# Patient Record
Sex: Female | Born: 1980 | Race: White | Hispanic: No | Marital: Single | Smoking: Current every day smoker
Health system: Southern US, Community
[De-identification: ages and names within clinical notes are randomized; demographics above are authoritative.]

---

## 2004-04-13 ENCOUNTER — Emergency Department: Payer: Self-pay | Admitting: Emergency Medicine

## 2004-12-02 ENCOUNTER — Emergency Department: Payer: Self-pay | Admitting: Emergency Medicine

## 2005-06-13 ENCOUNTER — Emergency Department: Payer: Self-pay | Admitting: Internal Medicine

## 2005-07-05 ENCOUNTER — Emergency Department: Payer: Self-pay | Admitting: Emergency Medicine

## 2005-07-19 ENCOUNTER — Emergency Department: Payer: Self-pay | Admitting: Emergency Medicine

## 2005-10-01 ENCOUNTER — Emergency Department: Payer: Self-pay | Admitting: Internal Medicine

## 2005-10-24 ENCOUNTER — Observation Stay: Payer: Self-pay | Admitting: Obstetrics and Gynecology

## 2005-11-01 ENCOUNTER — Other Ambulatory Visit: Payer: Self-pay

## 2005-11-01 ENCOUNTER — Emergency Department: Payer: Self-pay | Admitting: Emergency Medicine

## 2006-04-06 ENCOUNTER — Emergency Department: Payer: Self-pay | Admitting: Emergency Medicine

## 2006-06-11 ENCOUNTER — Emergency Department: Payer: Self-pay | Admitting: Emergency Medicine

## 2007-01-04 ENCOUNTER — Emergency Department: Payer: Self-pay | Admitting: Emergency Medicine

## 2007-01-09 ENCOUNTER — Ambulatory Visit: Payer: Self-pay | Admitting: Internal Medicine

## 2007-05-09 ENCOUNTER — Ambulatory Visit: Payer: Self-pay | Admitting: Family Medicine

## 2007-08-22 ENCOUNTER — Emergency Department: Payer: Self-pay | Admitting: Emergency Medicine

## 2007-10-10 ENCOUNTER — Emergency Department (HOSPITAL_COMMUNITY): Admission: EM | Admit: 2007-10-10 | Discharge: 2007-10-10 | Payer: Self-pay | Admitting: Emergency Medicine

## 2007-11-12 ENCOUNTER — Emergency Department: Payer: Self-pay | Admitting: Emergency Medicine

## 2008-02-23 ENCOUNTER — Emergency Department: Payer: Self-pay | Admitting: Emergency Medicine

## 2009-02-17 IMAGING — US US PELV - US TRANSVAGINAL
1 series · 17 of 25 positions shown · non-contrast
Comparison: none

REASON FOR EXAM: sp pain , right adnexal pain, sudden onset, torsion
COMMENTS:

[Series 1: us pelv - us transvaginal · 17 of 49 slices shown]
[im 1/49]
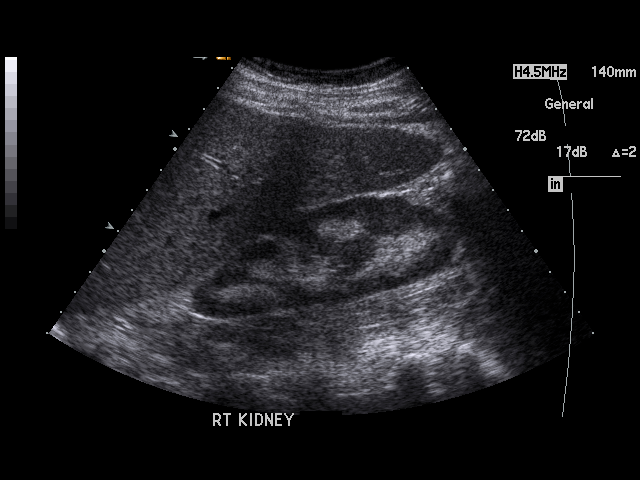
[im 5/49]
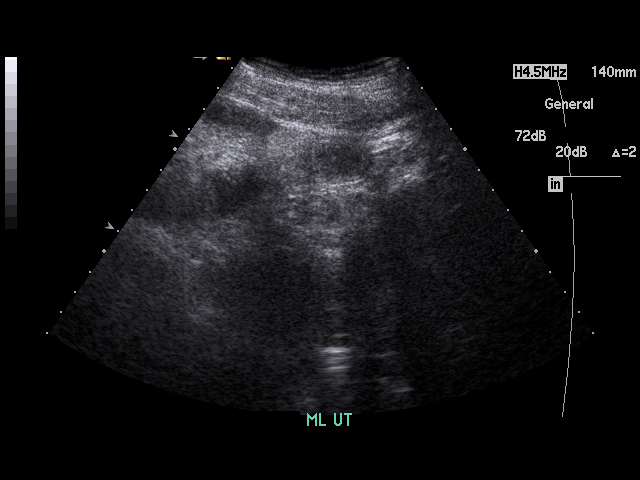
[im 7/49]
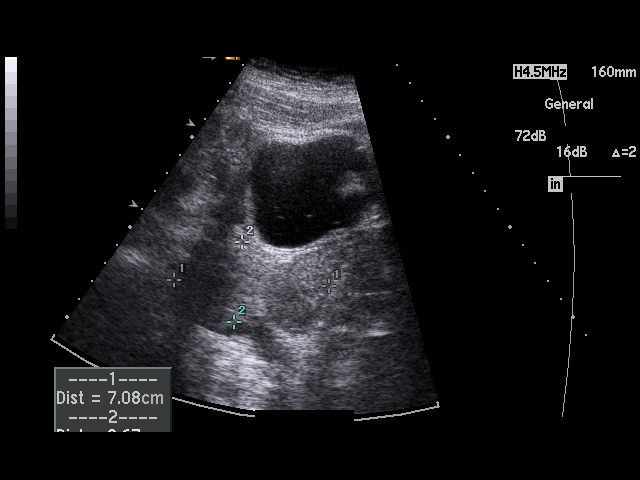
[im 11/49]
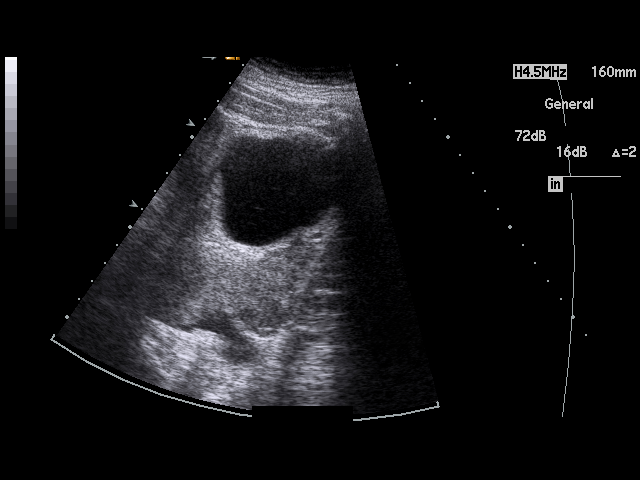
[im 13/49]
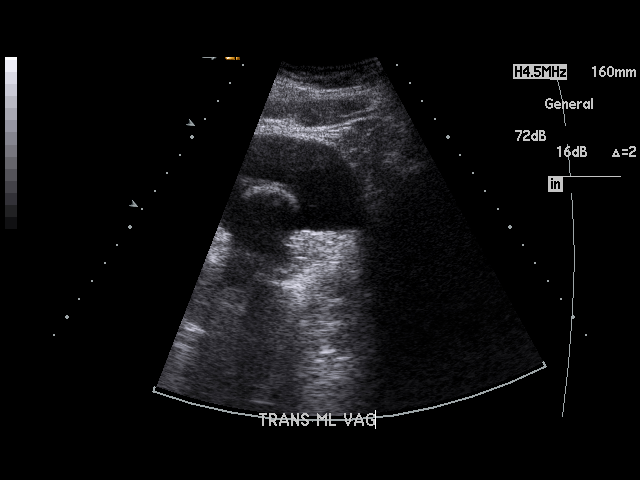
[im 17/49]
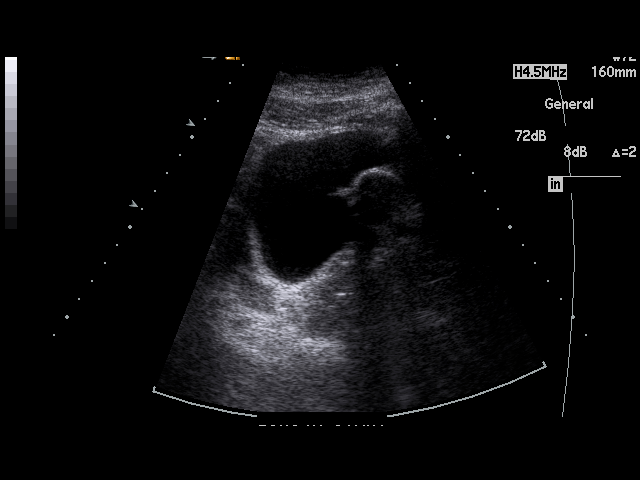
[im 19/49]
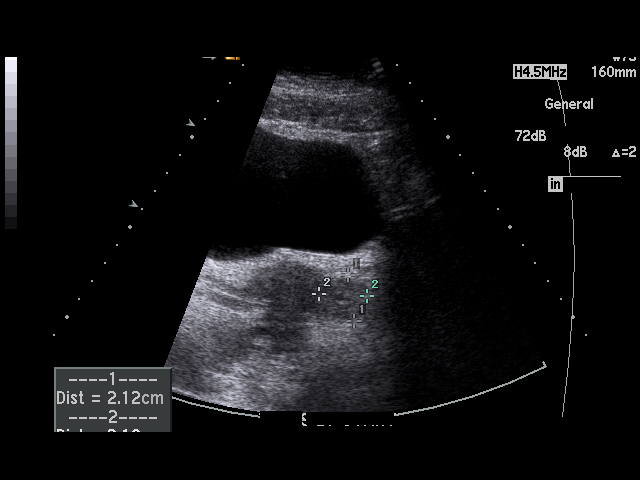
[im 23/49]
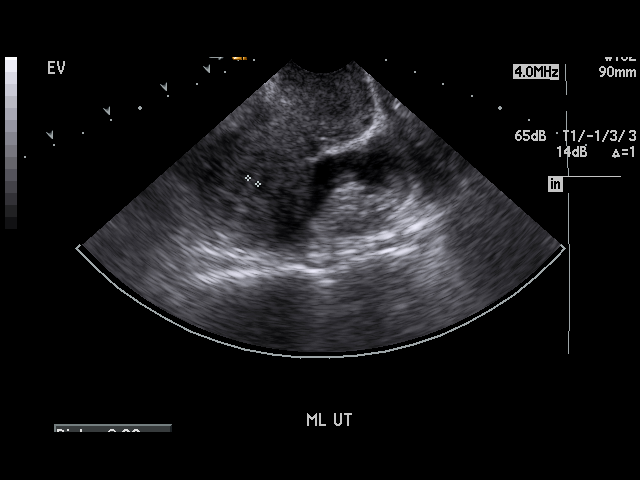
[im 25/49]
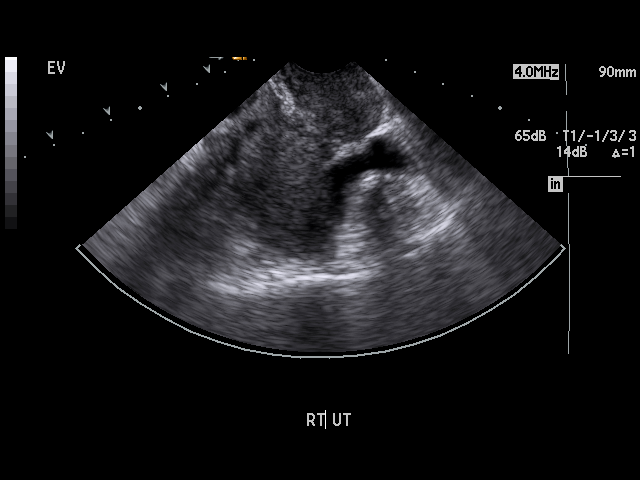
[im 27/49]
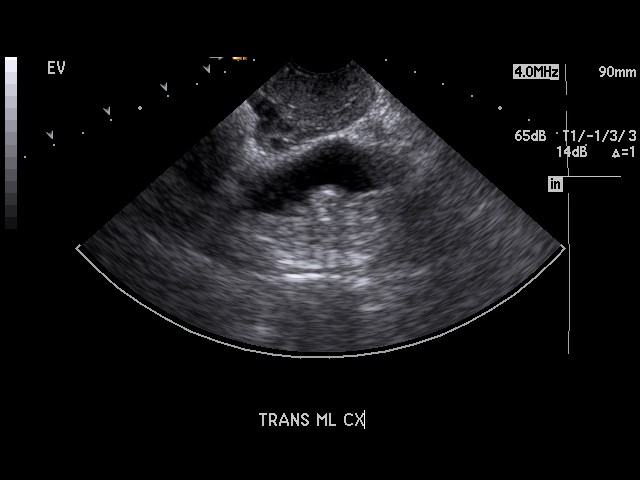
[im 31/49]
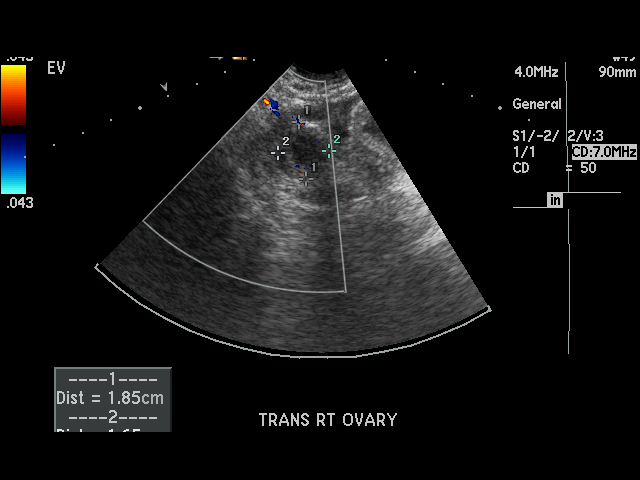
[im 33/49]
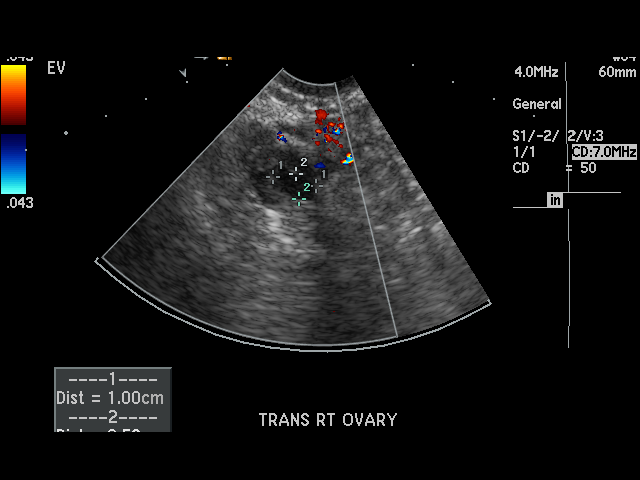
[im 37/49]
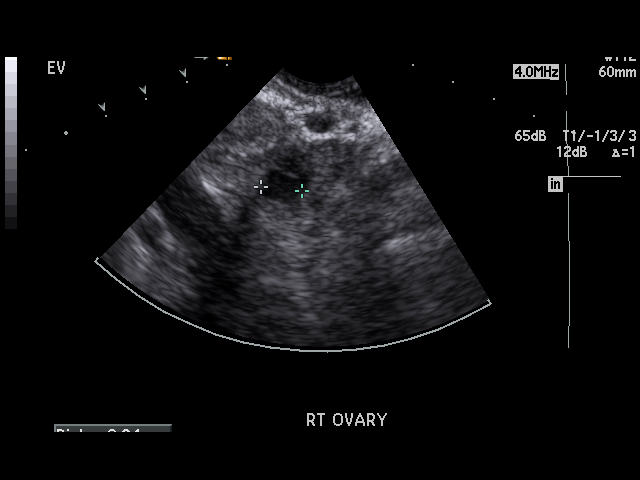
[im 39/49]
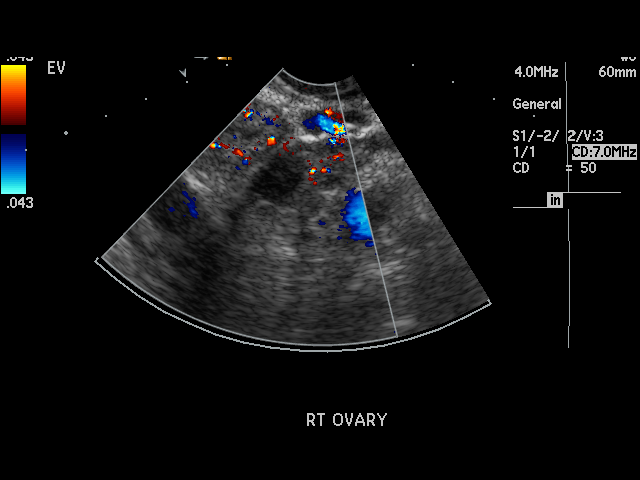
[im 43/49]
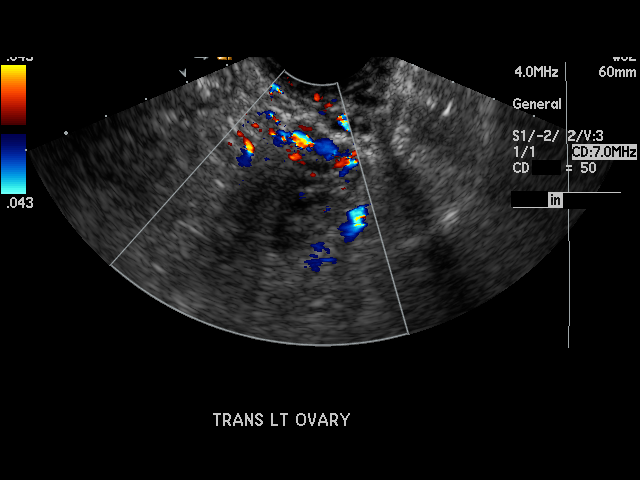
[im 45/49]
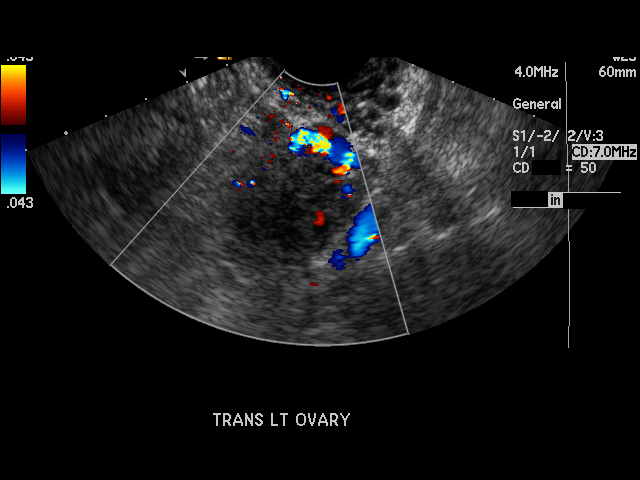
[im 49/49]
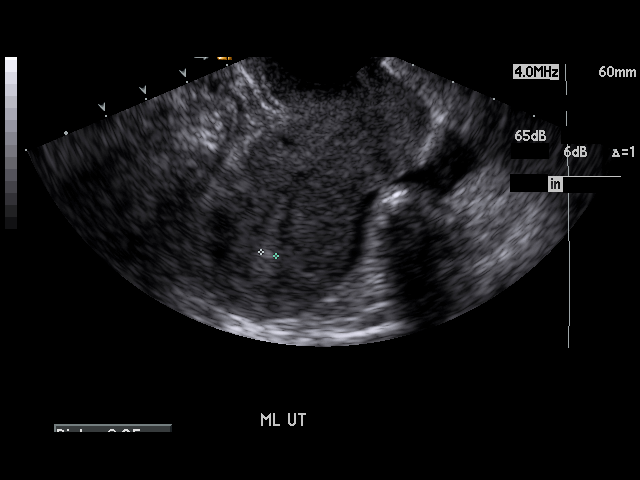

[17 of 25 positions shown; findings below may reference images not displayed]

PROCEDURE:     US  - US PELVIS MASS EXAM  - [DATE]  [DATE] [DATE]  [DATE]

RESULT:     Sonographic evaluation of the pelvis is performed utilizing
transabdominal and endovaginal scanning. The uterus measures 7.1 x 3.7 x
cm. The endometrial stripe thickness is 3.5 mm. The kidneys appear to be
grossly normal. There is urine in the bladder. The LEFT ovary is normal at
1.3 x 1.8 x 1.3 cm. The RIGHT ovary contains a 1.0 x 0.5 x 0.9 cm follicle.
A small amount of free fluid is seen in the cul-de-sac.
IMPRESSION: RIGHT ovarian follicle. Essentially normal pelvic sonogram.

## 2009-05-20 ENCOUNTER — Emergency Department: Payer: Self-pay | Admitting: Emergency Medicine

## 2009-07-09 ENCOUNTER — Ambulatory Visit: Payer: Self-pay | Admitting: Gastroenterology

## 2009-08-11 ENCOUNTER — Ambulatory Visit: Payer: Self-pay | Admitting: Gastroenterology

## 2010-07-02 ENCOUNTER — Emergency Department: Payer: Self-pay | Admitting: Emergency Medicine

## 2010-11-02 ENCOUNTER — Emergency Department: Payer: Self-pay | Admitting: Emergency Medicine

## 2011-07-09 ENCOUNTER — Ambulatory Visit: Payer: Self-pay | Admitting: Obstetrics & Gynecology

## 2011-07-09 LAB — CBC
HCT: 37.9 % (ref 35.0–47.0)
HGB: 12.4 g/dL (ref 12.0–16.0)
MCHC: 32.6 g/dL (ref 32.0–36.0)
MCV: 90 fL (ref 80–100)
RBC: 4.21 10*6/uL (ref 3.80–5.20)
RDW: 12.8 % (ref 11.5–14.5)

## 2011-07-09 LAB — PREGNANCY, URINE: Pregnancy Test, Urine: NEGATIVE m[IU]/mL

## 2011-07-15 ENCOUNTER — Ambulatory Visit: Payer: Self-pay | Admitting: Obstetrics & Gynecology

## 2011-07-15 LAB — PREGNANCY, URINE: Pregnancy Test, Urine: NEGATIVE m[IU]/mL

## 2011-07-19 LAB — PATHOLOGY REPORT

## 2011-10-30 ENCOUNTER — Emergency Department: Payer: Self-pay | Admitting: Emergency Medicine

## 2012-08-15 IMAGING — US US PELV - US TRANSVAGINAL
1 series · 17 of 25 positions shown · non-contrast
Comparison: none

REASON FOR EXAM: Pelvic pain with vaginal mass
COMMENTS:   LMP: Two weeks ago

[Series 1: us pelv - us transvaginal · 17 of 49 slices shown]
[im 1/49]
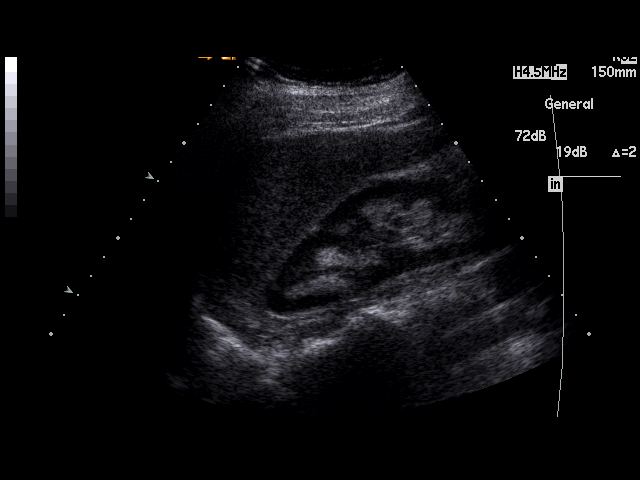
[im 5/49]
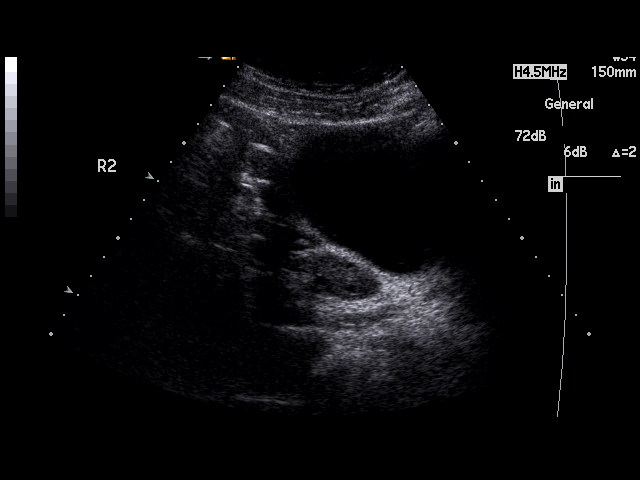
[im 7/49]
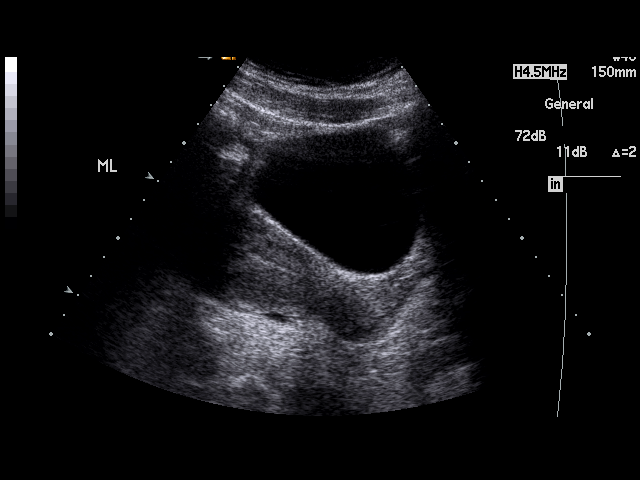
[im 11/49]
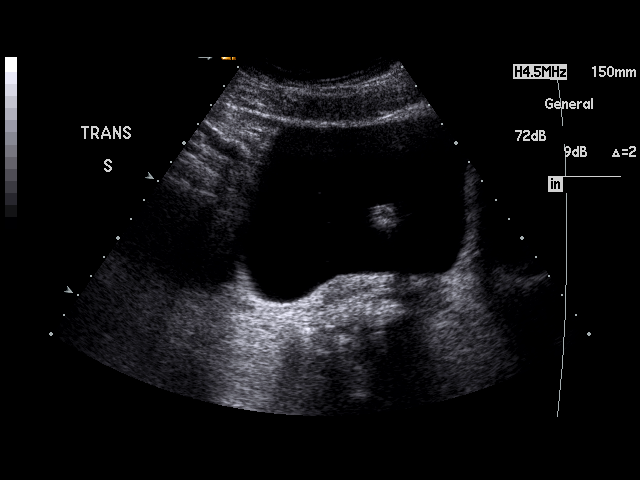
[im 13/49]
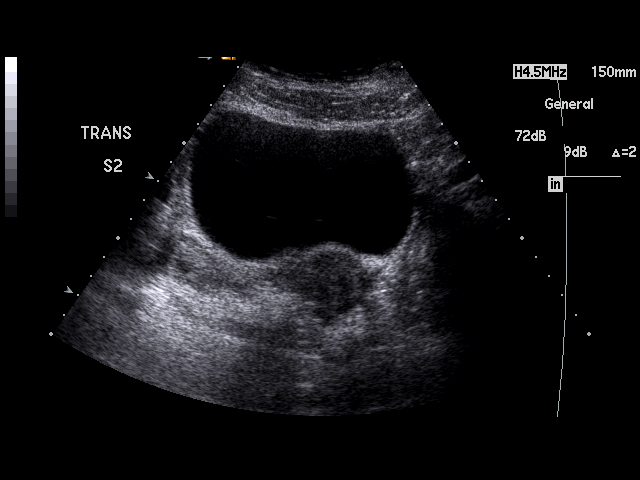
[im 17/49]
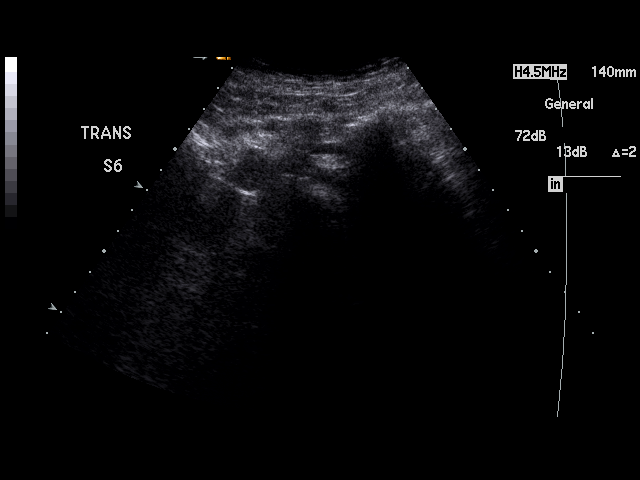
[im 19/49]
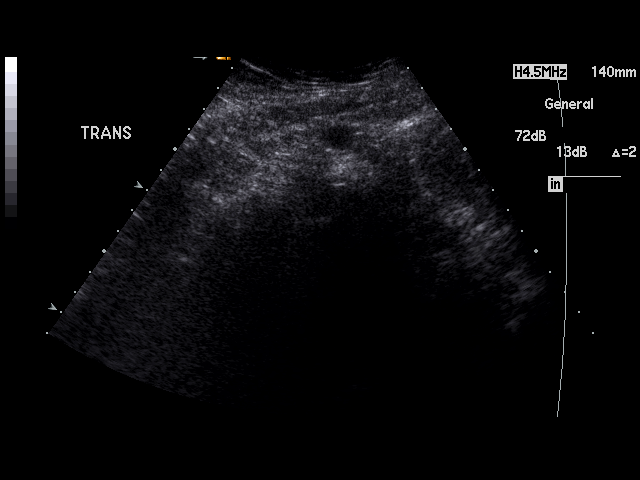
[im 23/49]
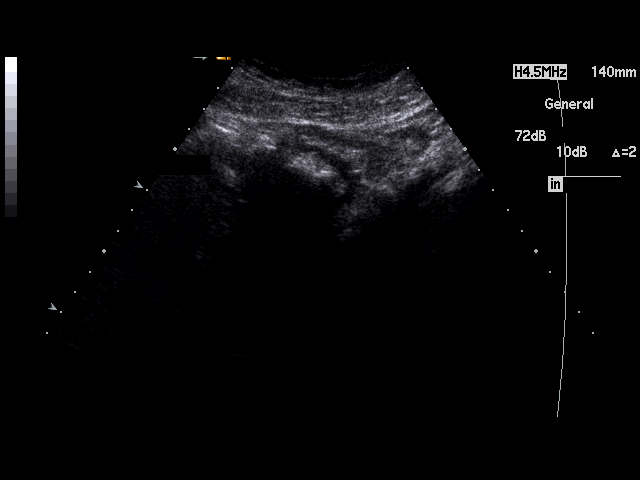
[im 25/49]
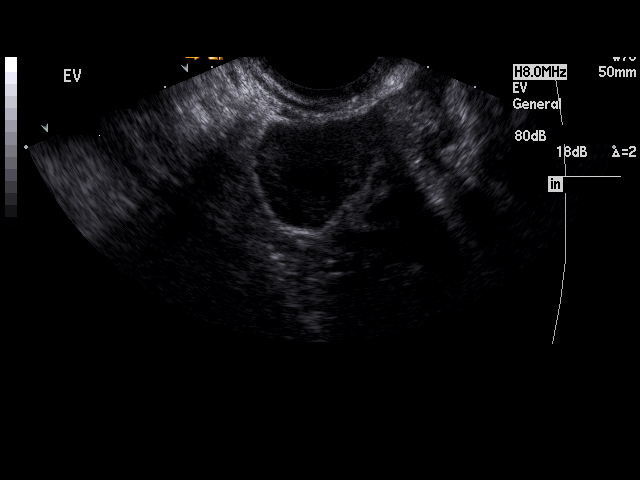
[im 27/49]
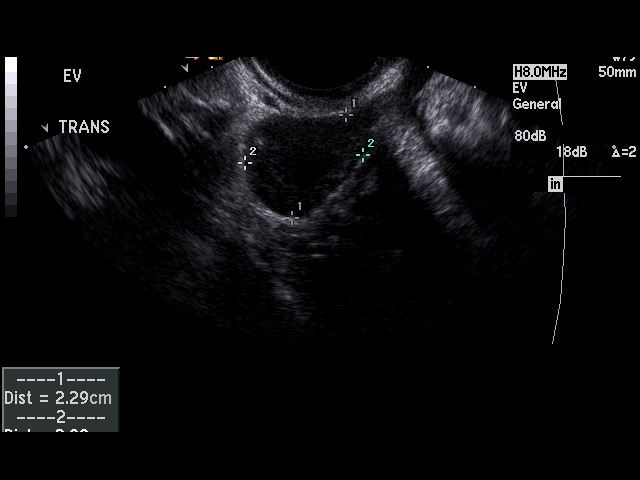
[im 31/49]
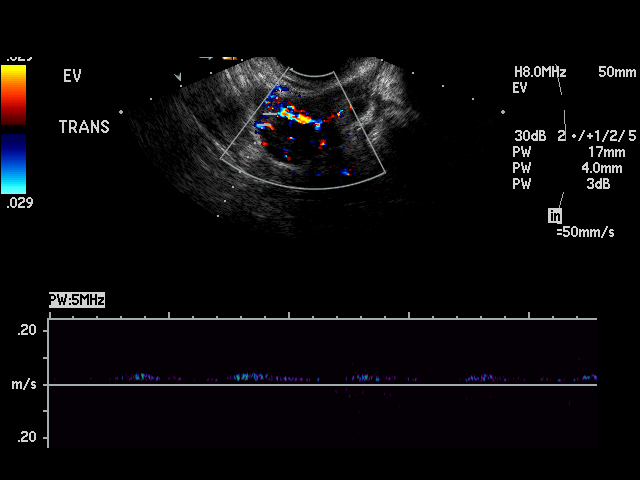
[im 33/49]
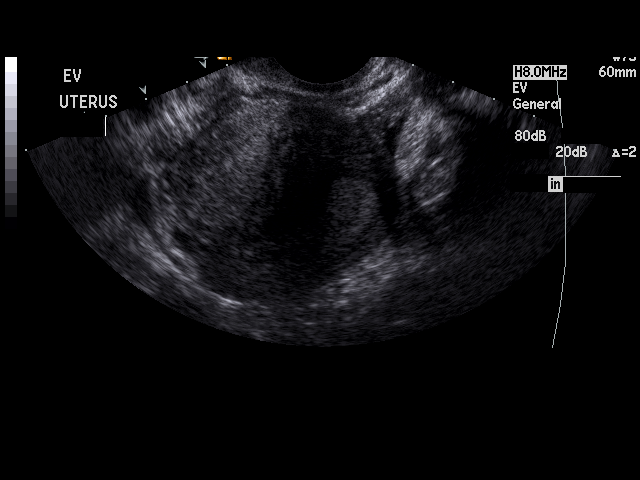
[im 37/49]
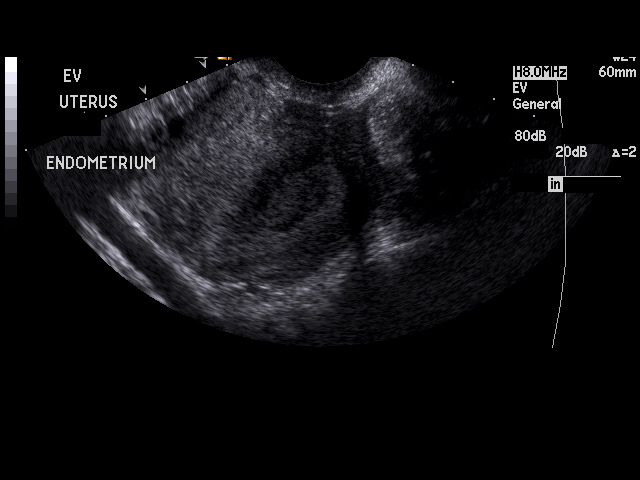
[im 39/49]
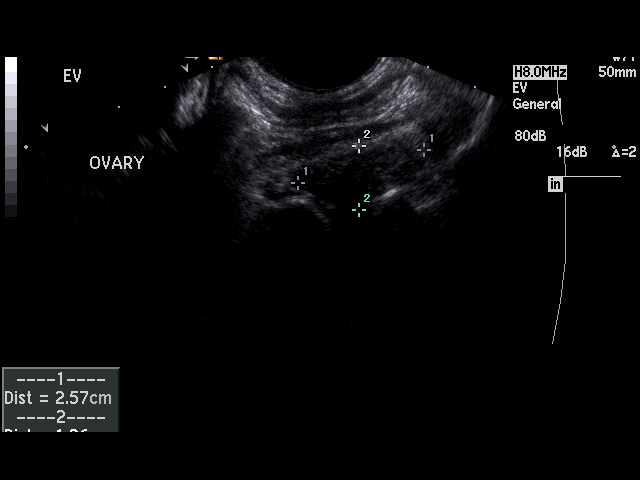
[im 43/49]
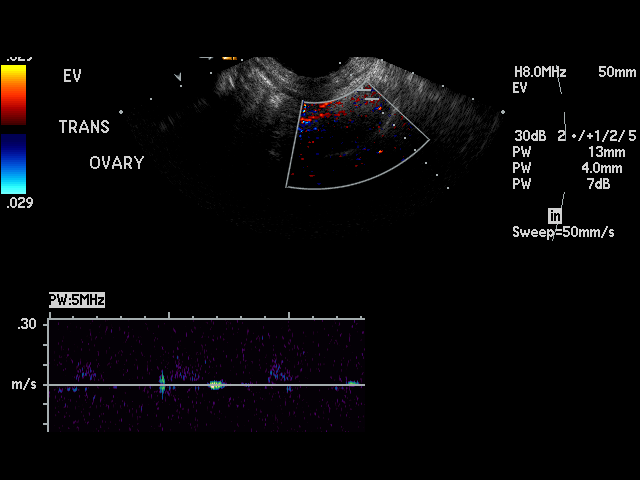
[im 45/49]
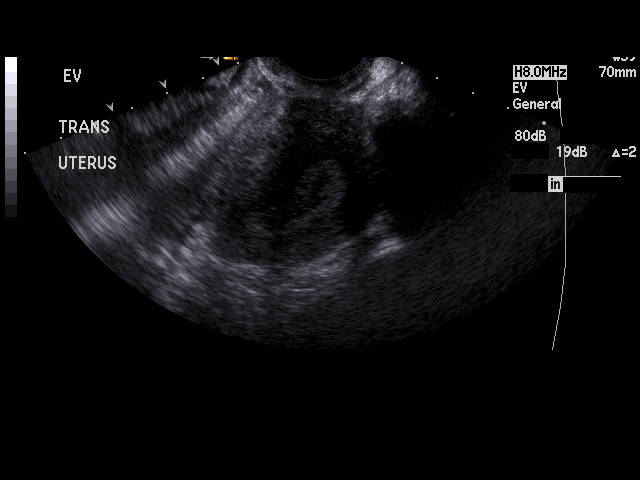
[im 49/49]
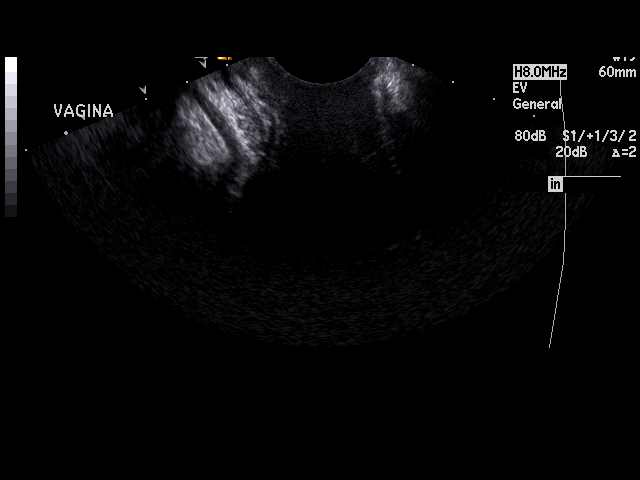

[17 of 25 positions shown; findings below may reference images not displayed]

PROCEDURE:     US  - US PELVIS EXAM W/TRANSVAGINAL  - July 02, 2010 [DATE]

RESULT:     Comparison: 01/04/2007

Technique and Findings:
Multiple grayscale and color Doppler images were obtained of the pelvis via
transabdominal and endovaginal ultrasound.

The uterus measures 9.5 x 4.1 x 5.1 cm. The endometrial stripe measures 11
mm. There is a trace amount of fluid in the endometrial canal, which is
nonspecific.

No adnexal mass identified. No free fluid in the cul-de-sac. The right ovary
measures 2.0 x 2.3 x 2.3 cm. The left ovary measures 2.6 x 1.9 x 2.2 cm.
Arterial and venous to the upper waveforms are demonstrated in the bilateral
ovaries.
IMPRESSION: No acute findings. Please note, this examination does not evaluate the
vagina.

## 2012-12-16 IMAGING — CR RIGHT GREAT TOE
1 series · 3 of 3 positions shown · non-contrast
Comparison: none

REASON FOR EXAM: injury; pt inlobby
COMMENTS:

[Series 1: view not recorded · 0.17mm/px · 3 of 3 slices shown]
[im 1/3]
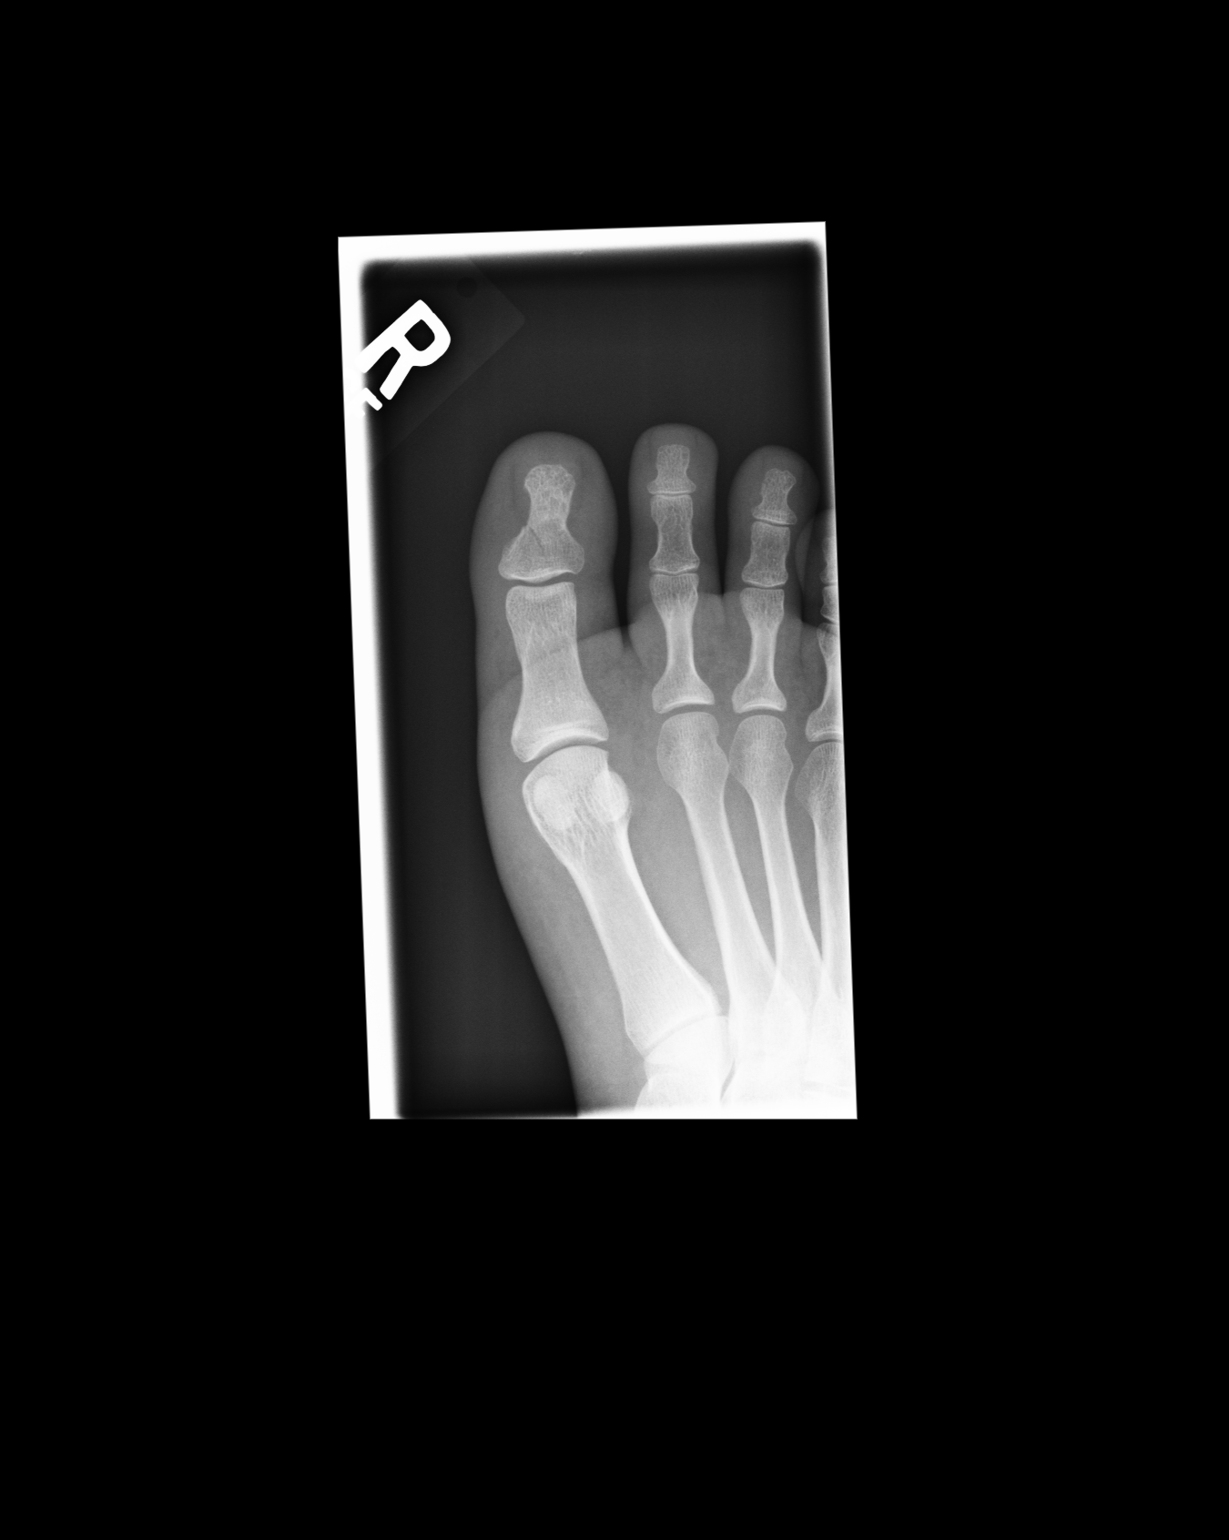
[im 2/3]
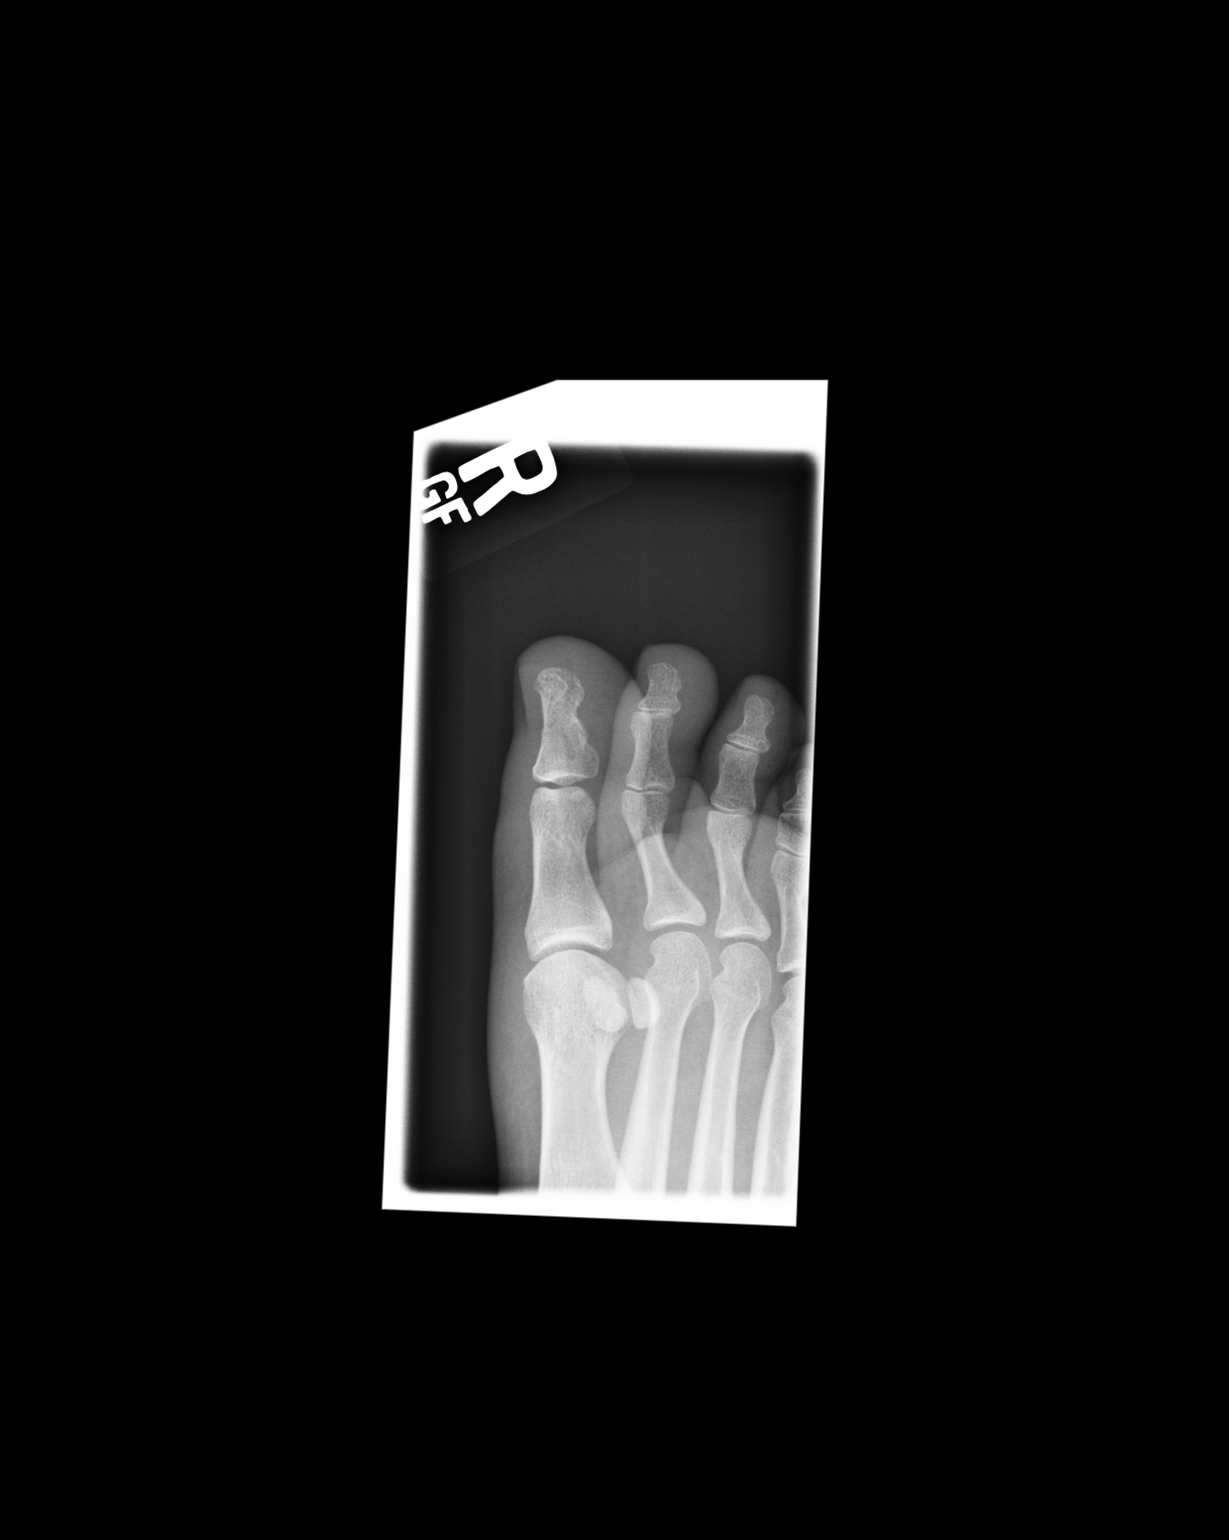
[im 3/3]
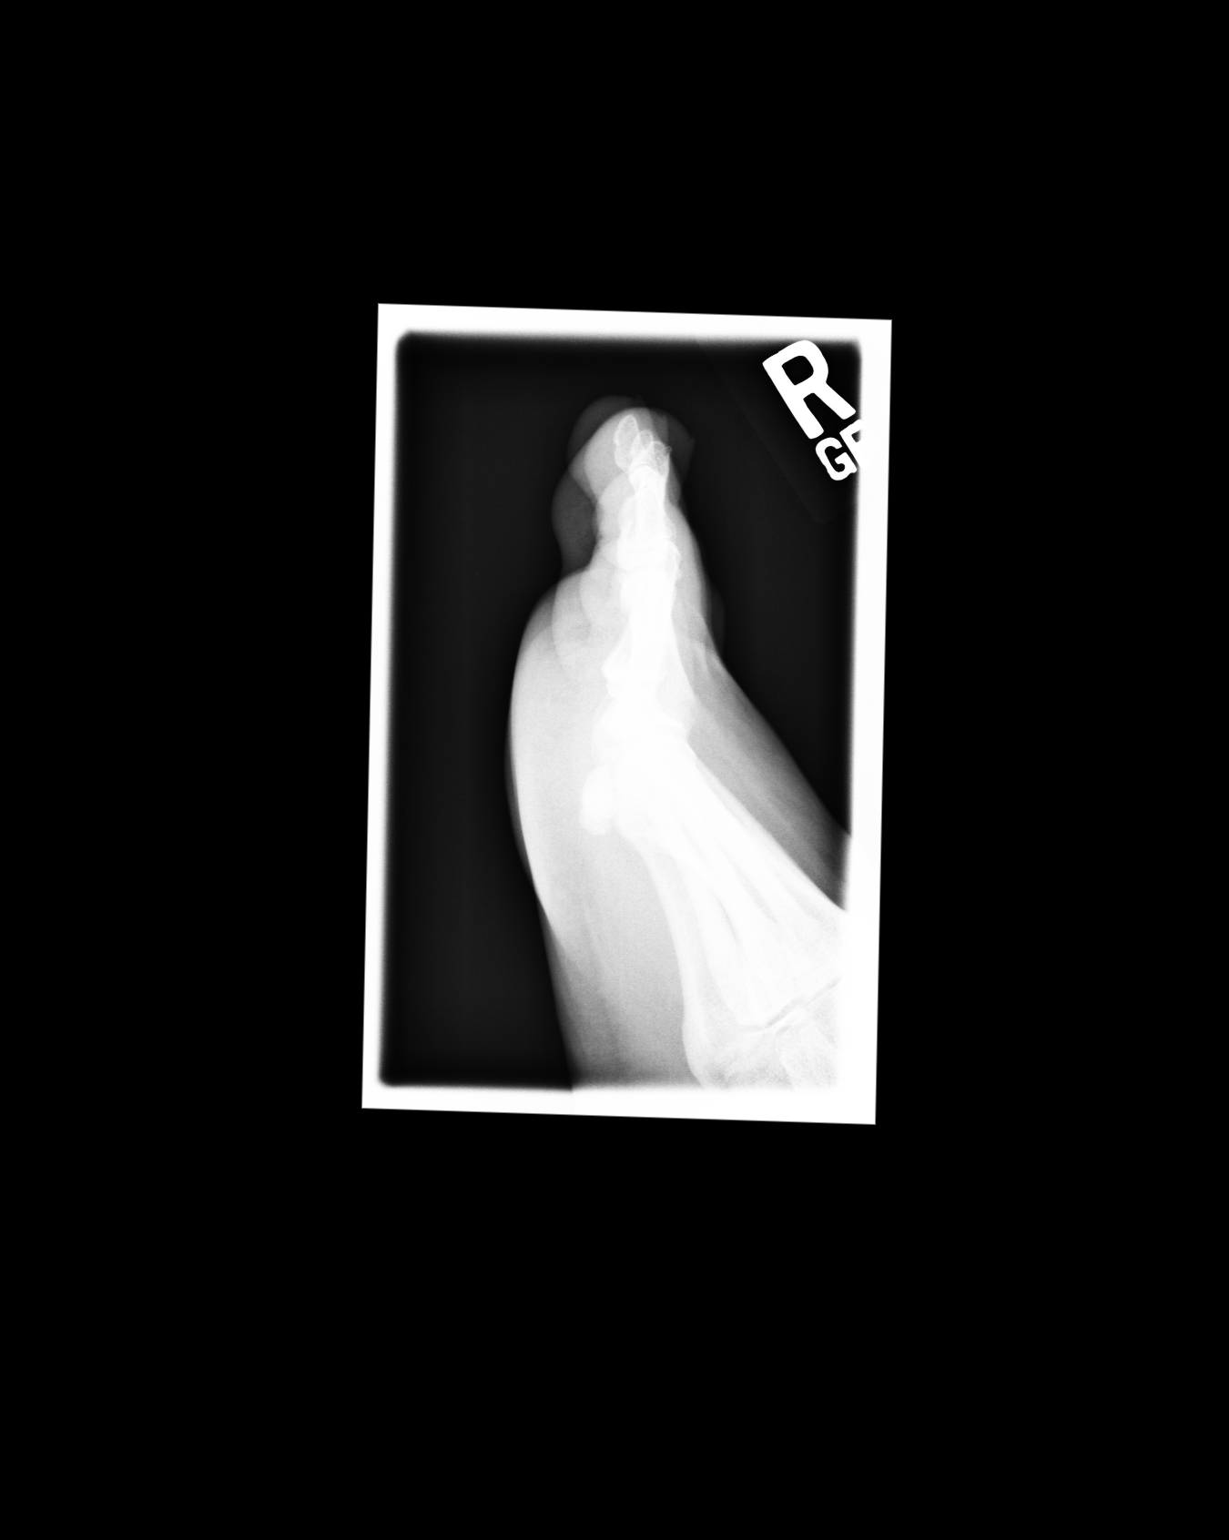

[3 of 3 positions shown; findings below may reference images not displayed]

PROCEDURE:     DXR - DXR TOE GREAT (1ST DIGIT) RT RIDOINE  - November 02, 2010  [DATE]

RESULT:     Three views of the right great toe are submitted. The bones are
adequately mineralized. There is a nondisplaced fracture obliquely oriented
through the base of the distal phalanx. The fracture does reach the
articular surface of the interphalangeal joint. The proximal phalanx is
intact.
IMPRESSION: There is a nondisplaced fracture through the base of the
distal phalanx of the right great toe.

## 2014-07-21 NOTE — Op Note (Signed)
PATIENT NAME:  Terri [name], Terri [name] MR#:  [id number] DATE OF BIRTH:  10-Apr-1980  DATE OF PROCEDURE:  07/15/2011  PREOPERATIVE DIAGNOSES: Pelvic organ prolapse, cystocele, menorrhagia.   POSTOPERATIVE DIAGNOSES: Pelvic organ prolapse, cystocele, menorrhagia.   PROCEDURE: Total vaginal hysterectomy, anterior colporrhaphy.  SURGEON: Dierdre Searles. Paul Harris, M.D.   ASSISTANT: Adria Devonarrie Klett, M.D.   ANESTHESIA: Spinal. ESTIMATED BLOOD LOSS: 25 mL.  COMPLICATIONS: None.  FINDINGS: The patient had pelvic organ prolapse with cystocele. DISPOSITION: To recovery room in stable condition.   TECHNIQUE: The patient is prepped and draped in the usual sterile fashion after adequate anesthesia is obtained in the dorsal lithotomy position. [name] Foley catheter is inserted. [name] speculum is placed and the cervix is grasped with [name] Jacob's tenaculum.  The circumference of the cervix is infiltrated with 1% lidocaine with epinephrine and then [name] circumcision incision is made using Bovie cautery. Dissection is then performed with Metzenbaum scissors to enter the anterior and posterior cul-de-sac. [name] long weighted speculum is placed in the posterior cul-de-sac. The uterosacral ligaments are grasped with Heaney clamp, transected, and suture ligated with the uterosacrals then sutured to the vaginal cuff. The uterine arteries are clamped, transected, and suture-ligated. Continued dissection is performed until the cornu is reached which are also transected and suture-ligated with complete amputation of the uterus with cervix.   Excellent hemostasis is visualized. The uterosacral ligaments are plicated using [name] permanent Ethibond suture. The peritoneum is then closed in [name] pursestring fashion using [name] 0 Vicryl suture. Allis clamps were then placed on [name] midline vaginal wall anteriorly for anterior colporrhaphy purposes. Incision is made with [name] scalpel and then extended with Metzenbaum scissors after the vaginal mucosa had been infiltrated with 1%  lidocaine with epinephrine. P-clamps are placed along the edges of the vaginal mucosa and then the endopelvic fascia is dissected away from the mucosa. The fascia is then plicated in an interrupted fashion using 0 Vicryl sutures. Excess vaginal mucosa is excised. The vaginal mucosa is then closed with [name] running 2-0 Vicryl suture in [name] running locking fashion to incorporate the colporrhaphy and the hysterectomy incision sites. Excellent hemostasis is noted. The vaginal cavity is irrigated with saline. [name] packing sponge with AVC cream is placed vaginally. Foley catheter is left in place. The patient goes to the Recovery Room in stable condition. All sponge, instrument and needle counts are correct.   ____________________________ R. Annamarie MajorPaul Harris, MD rph:ap D: 07/15/2011 09:13:27 ET T: 07/15/2011 12:41:15 ET JOB#: [id number]  cc: Dierdre Searles. Paul Harris, MD, <Dictator> Nadara MustardOBERT P HARRIS MD ELECTRONICALLY SIGNED 07/15/2011 18:21

## 2016-08-10 ENCOUNTER — Encounter: Payer: Self-pay | Admitting: *Deleted

## 2016-08-10 ENCOUNTER — Emergency Department: Payer: Medicare Other

## 2016-08-10 DIAGNOSIS — Y929 Unspecified place or not applicable: Secondary | ICD-10-CM | POA: Insufficient documentation

## 2016-08-10 DIAGNOSIS — Z5321 Procedure and treatment not carried out due to patient leaving prior to being seen by health care provider: Secondary | ICD-10-CM | POA: Insufficient documentation

## 2016-08-10 DIAGNOSIS — Y9301 Activity, walking, marching and hiking: Secondary | ICD-10-CM | POA: Diagnosis not present

## 2016-08-10 DIAGNOSIS — S6992XA Unspecified injury of left wrist, hand and finger(s), initial encounter: Secondary | ICD-10-CM | POA: Diagnosis present

## 2016-08-10 DIAGNOSIS — Y999 Unspecified external cause status: Secondary | ICD-10-CM | POA: Insufficient documentation

## 2016-08-10 DIAGNOSIS — S62615A Displaced fracture of proximal phalanx of left ring finger, initial encounter for closed fracture: Secondary | ICD-10-CM | POA: Insufficient documentation

## 2016-08-10 DIAGNOSIS — W01198A Fall on same level from slipping, tripping and stumbling with subsequent striking against other object, initial encounter: Secondary | ICD-10-CM | POA: Insufficient documentation

## 2016-08-10 NOTE — ED Triage Notes (Signed)
Pt fell and injured left hand, pt tripped on wet floor, left hand is swollen

## 2016-08-11 ENCOUNTER — Emergency Department
Admission: EM | Admit: 2016-08-11 | Discharge: 2016-08-11 | Disposition: A | Discharge status: Home / Self Care | Payer: Medicare Other | Attending: Emergency Medicine | Admitting: Emergency Medicine

## 2018-09-24 IMAGING — CR DG HAND COMPLETE 3+V*L*
1 series · 3 of 3 positions shown · non-contrast
Comparison: None.

CLINICAL DATA: Injury with pain and edema

EXAM:
LEFT HAND - COMPLETE 3+ VIEW

[Series 1: x hand pa left · 0.14mm/px · 3 of 3 slices shown]
[im 1/3]
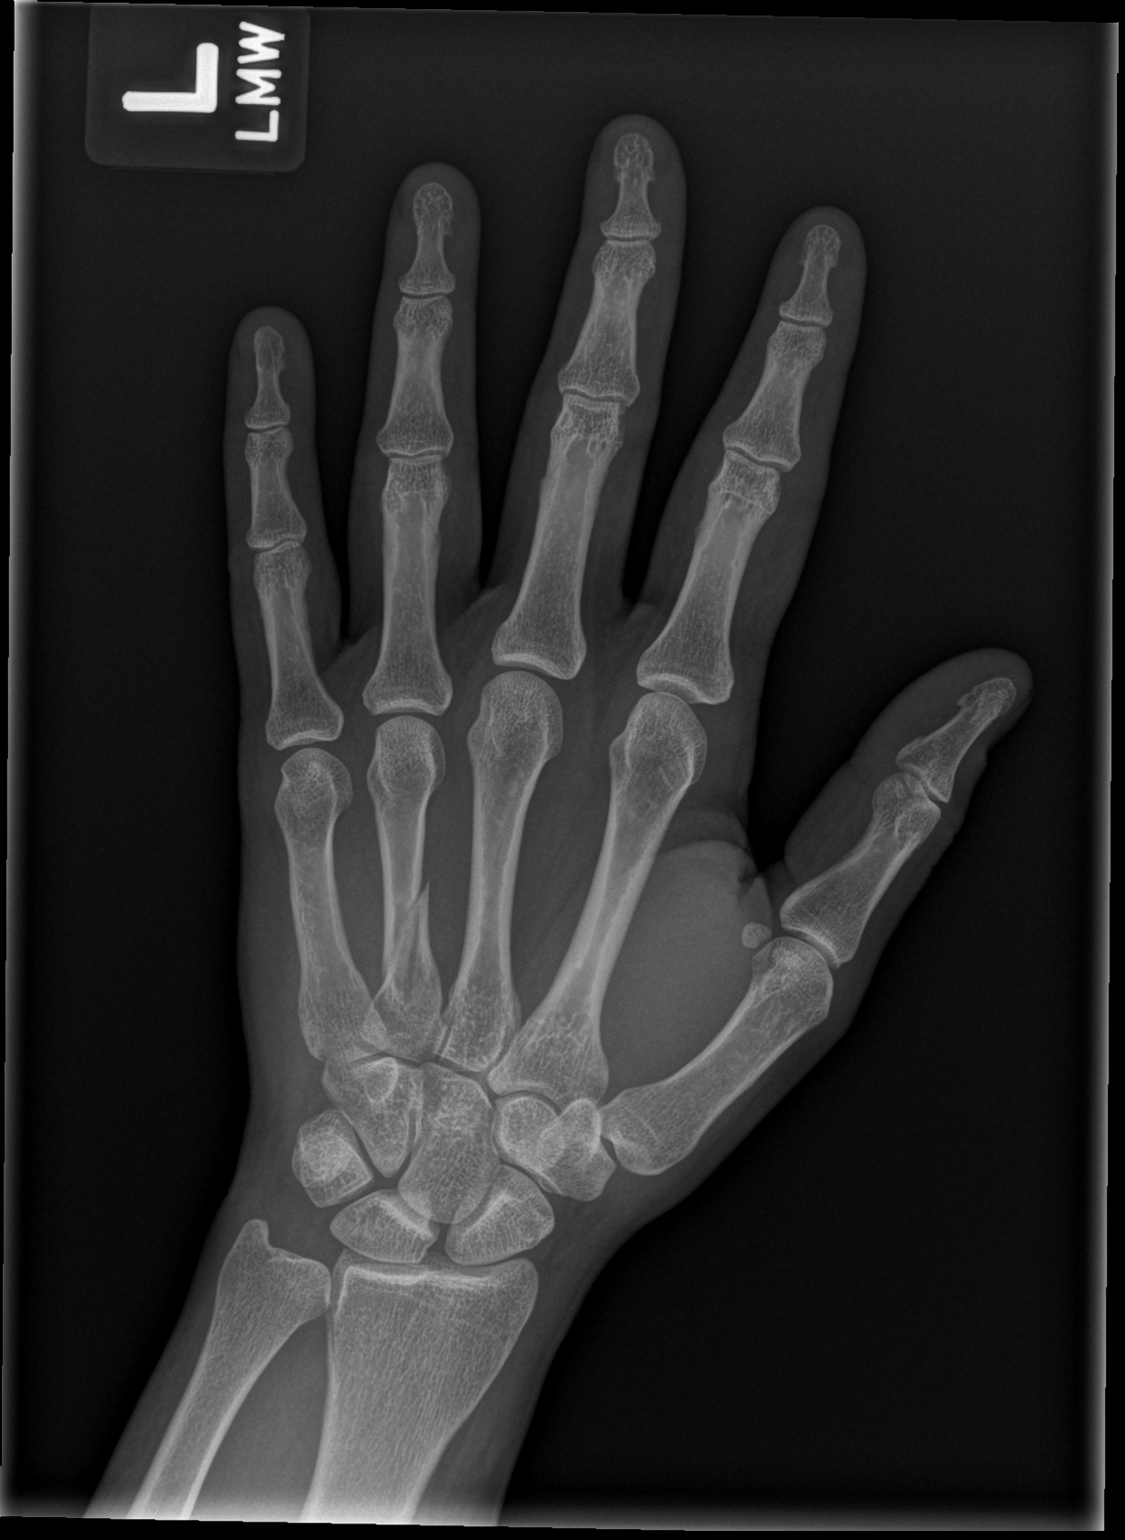
[im 2/3]
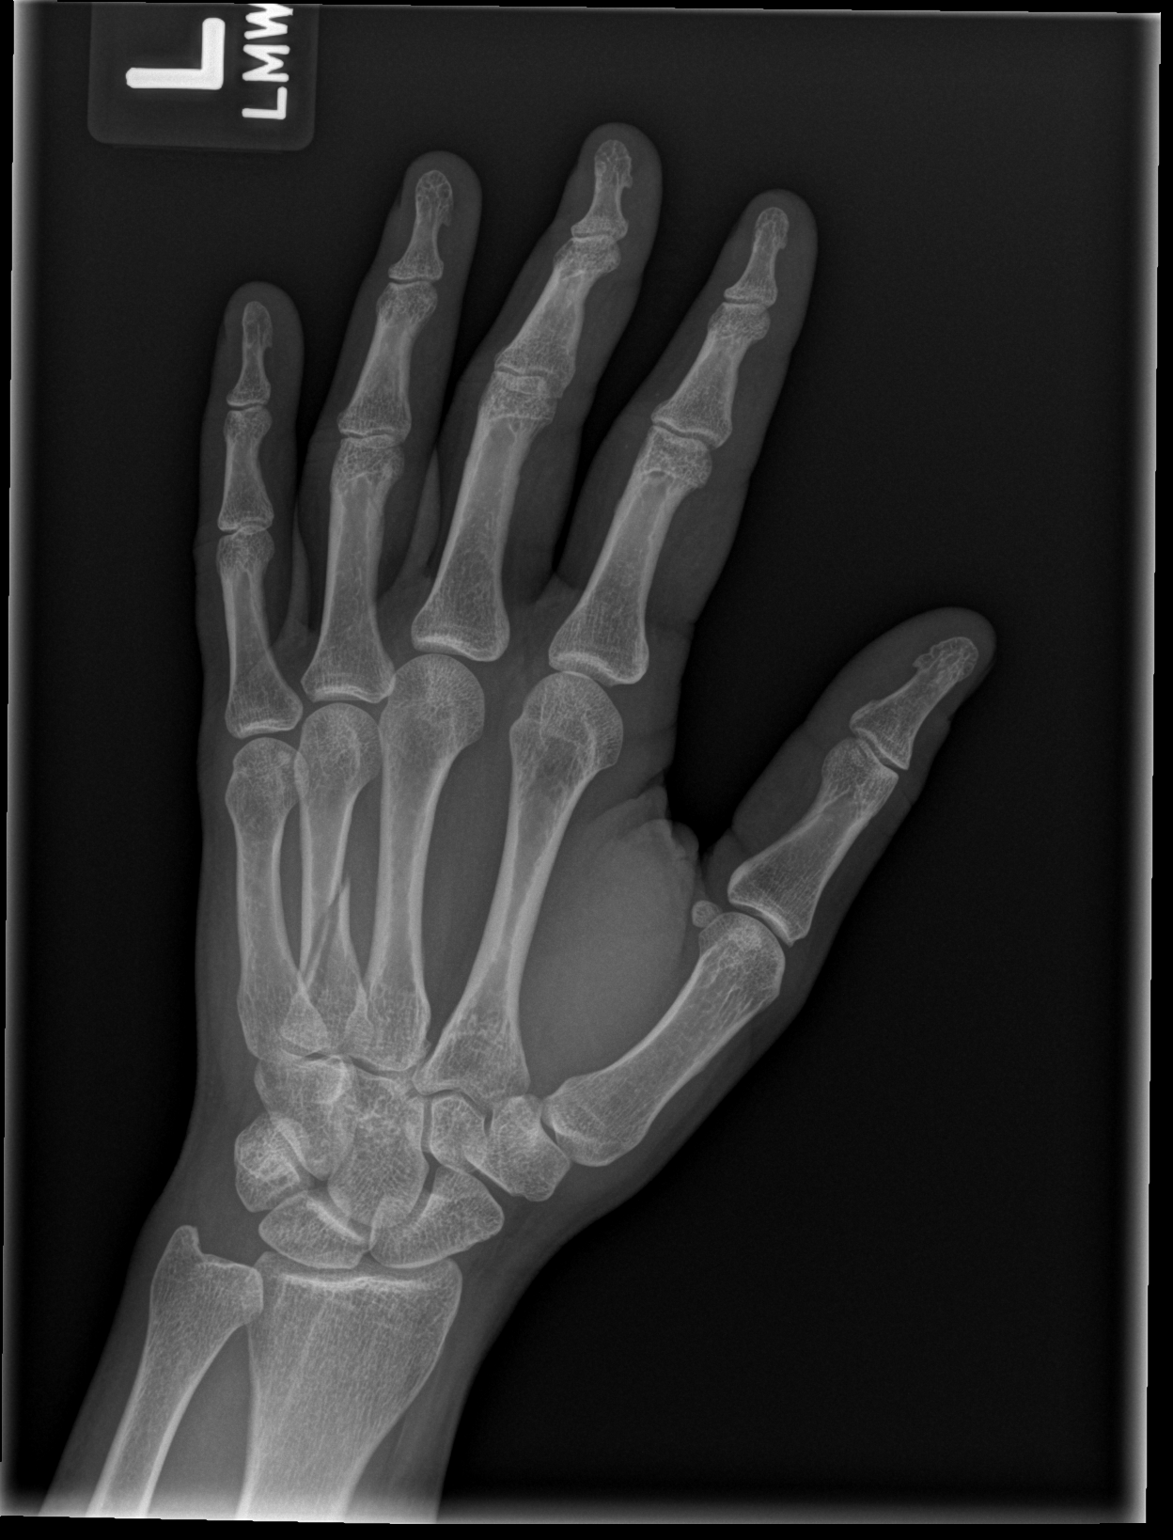
[im 3/3]
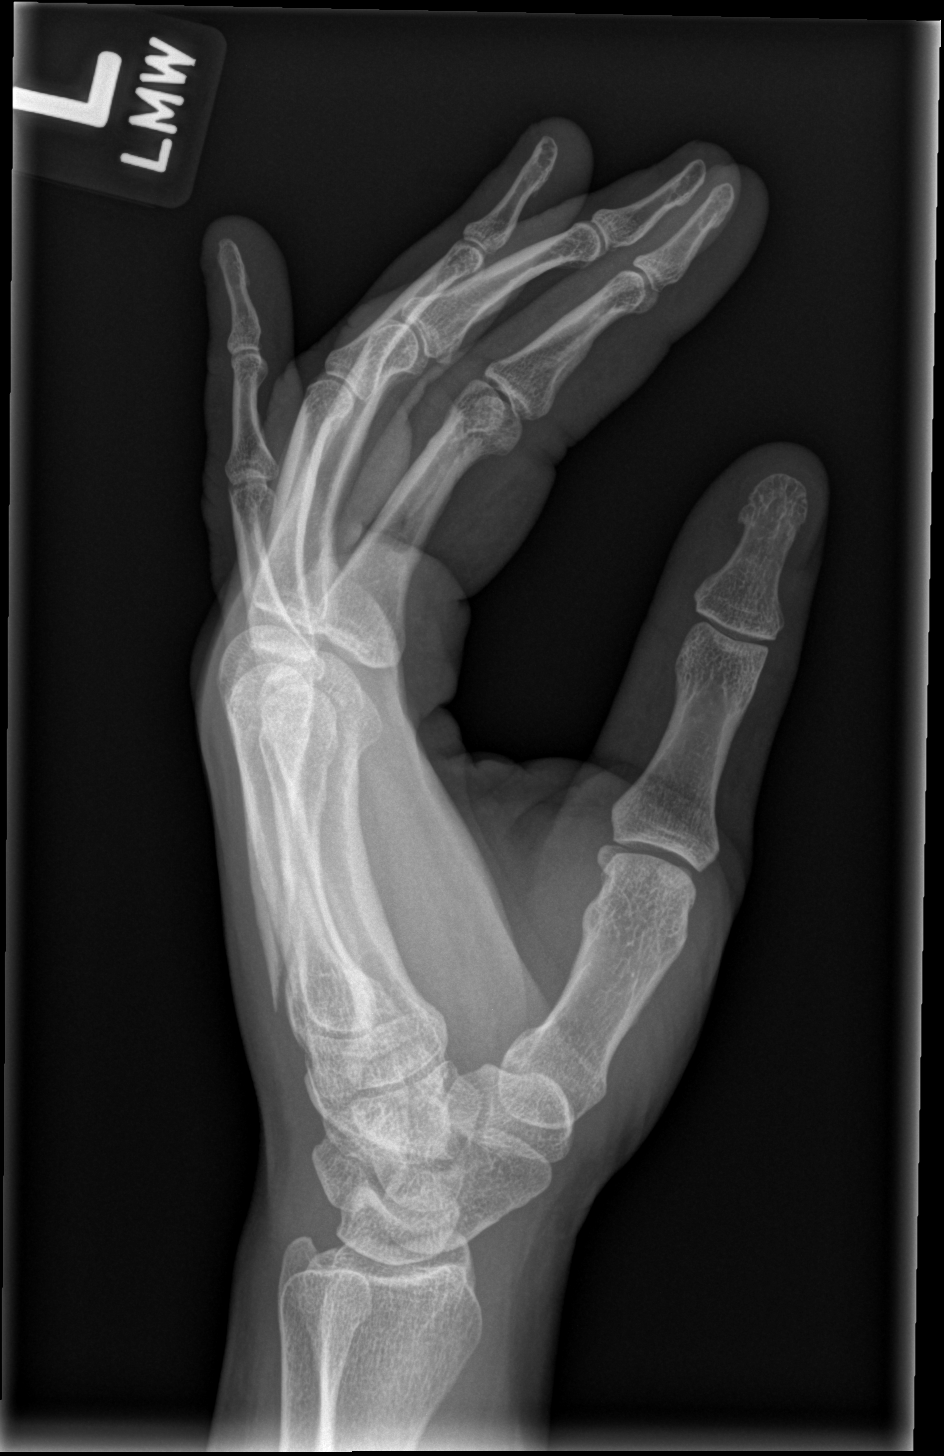

[3 of 3 positions shown; findings below may reference images not displayed]

FINDINGS: Oblique fracture through the mid and proximal shaft of the fourth
metacarpal. Approximately [DATE] shaft diameter of ulnar displacement
of distal fracture fragment. No significant angulation.
IMPRESSION: Mildly displaced fracture involving the mid and proximal shaft of
the fourth metacarpal.
